# Patient Record
Sex: Female | Born: 1975 | Race: Black or African American | Hispanic: No | Marital: Single | Smoking: Never smoker
Health system: Southern US, Community
[De-identification: ages and names within clinical notes are randomized; demographics above are authoritative.]

## PROBLEM LIST (undated history)

## (undated) DIAGNOSIS — E7800 Pure hypercholesterolemia, unspecified: Secondary | ICD-10-CM

## (undated) DIAGNOSIS — I10 Essential (primary) hypertension: Secondary | ICD-10-CM

---

## 1997-12-03 ENCOUNTER — Ambulatory Visit (HOSPITAL_COMMUNITY): Admission: RE | Admit: 1997-12-03 | Discharge: 1997-12-03 | Payer: Self-pay | Admitting: Obstetrics

## 1997-12-03 ENCOUNTER — Other Ambulatory Visit: Admission: RE | Admit: 1997-12-03 | Discharge: 1997-12-03 | Payer: Self-pay | Admitting: Obstetrics

## 1998-01-01 ENCOUNTER — Ambulatory Visit (HOSPITAL_COMMUNITY): Admission: RE | Admit: 1998-01-01 | Discharge: 1998-01-01 | Payer: Self-pay | Admitting: Obstetrics

## 1998-03-27 ENCOUNTER — Ambulatory Visit (HOSPITAL_COMMUNITY): Admission: RE | Admit: 1998-03-27 | Discharge: 1998-03-27 | Payer: Self-pay | Admitting: Obstetrics

## 1998-04-07 ENCOUNTER — Ambulatory Visit (HOSPITAL_COMMUNITY): Admission: RE | Admit: 1998-04-07 | Discharge: 1998-04-07 | Payer: Self-pay | Admitting: Obstetrics

## 1998-05-05 ENCOUNTER — Inpatient Hospital Stay (HOSPITAL_COMMUNITY): Admission: AD | Admit: 1998-05-05 | Discharge: 1998-05-12 | Payer: Self-pay | Admitting: Obstetrics

## 2000-03-11 ENCOUNTER — Emergency Department (HOSPITAL_COMMUNITY): Admission: EM | Admit: 2000-03-11 | Discharge: 2000-03-11 | Payer: Self-pay | Admitting: Emergency Medicine

## 2000-03-11 ENCOUNTER — Encounter: Payer: Self-pay | Admitting: Emergency Medicine

## 2001-07-31 ENCOUNTER — Encounter: Payer: Self-pay | Admitting: Emergency Medicine

## 2001-07-31 ENCOUNTER — Emergency Department (HOSPITAL_COMMUNITY): Admission: EM | Admit: 2001-07-31 | Discharge: 2001-07-31 | Payer: Self-pay | Admitting: Emergency Medicine

## 2002-02-06 ENCOUNTER — Inpatient Hospital Stay (HOSPITAL_COMMUNITY): Admission: AD | Admit: 2002-02-06 | Discharge: 2002-02-06 | Payer: Self-pay | Admitting: Obstetrics

## 2002-02-06 ENCOUNTER — Encounter: Payer: Self-pay | Admitting: Obstetrics

## 2002-03-13 ENCOUNTER — Encounter (INDEPENDENT_AMBULATORY_CARE_PROVIDER_SITE_OTHER): Payer: Self-pay

## 2002-03-13 ENCOUNTER — Inpatient Hospital Stay (HOSPITAL_COMMUNITY): Admission: AD | Admit: 2002-03-13 | Discharge: 2002-03-16 | Payer: Self-pay | Admitting: Obstetrics

## 2002-11-12 ENCOUNTER — Emergency Department (HOSPITAL_COMMUNITY): Admission: EM | Admit: 2002-11-12 | Discharge: 2002-11-12 | Payer: Self-pay | Admitting: Emergency Medicine

## 2004-08-10 ENCOUNTER — Emergency Department (HOSPITAL_COMMUNITY): Admission: EM | Admit: 2004-08-10 | Discharge: 2004-08-11 | Payer: Self-pay | Admitting: Emergency Medicine

## 2005-05-22 ENCOUNTER — Emergency Department (HOSPITAL_COMMUNITY): Admission: EM | Admit: 2005-05-22 | Discharge: 2005-05-22 | Payer: Self-pay | Admitting: Emergency Medicine

## 2006-03-30 ENCOUNTER — Emergency Department (HOSPITAL_COMMUNITY): Admission: EM | Admit: 2006-03-30 | Discharge: 2006-03-30 | Payer: Self-pay | Admitting: Emergency Medicine

## 2006-07-06 ENCOUNTER — Encounter: Admission: RE | Admit: 2006-07-06 | Discharge: 2006-07-06 | Payer: Self-pay | Admitting: Obstetrics and Gynecology

## 2007-08-26 ENCOUNTER — Emergency Department (HOSPITAL_COMMUNITY): Admission: EM | Admit: 2007-08-26 | Discharge: 2007-08-26 | Payer: Self-pay | Admitting: Emergency Medicine

## 2008-02-24 ENCOUNTER — Emergency Department (HOSPITAL_COMMUNITY): Admission: EM | Admit: 2008-02-24 | Discharge: 2008-02-24 | Payer: Self-pay | Admitting: Emergency Medicine

## 2008-03-03 ENCOUNTER — Emergency Department (HOSPITAL_COMMUNITY): Admission: EM | Admit: 2008-03-03 | Discharge: 2008-03-03 | Payer: Self-pay | Admitting: Emergency Medicine

## 2008-03-06 ENCOUNTER — Emergency Department (HOSPITAL_COMMUNITY): Admission: EM | Admit: 2008-03-06 | Discharge: 2008-03-06 | Payer: Self-pay | Admitting: Emergency Medicine

## 2008-04-29 ENCOUNTER — Emergency Department (HOSPITAL_COMMUNITY): Admission: EM | Admit: 2008-04-29 | Discharge: 2008-04-29 | Payer: Self-pay | Admitting: Emergency Medicine

## 2008-05-31 ENCOUNTER — Emergency Department (HOSPITAL_COMMUNITY): Admission: EM | Admit: 2008-05-31 | Discharge: 2008-05-31 | Payer: Self-pay | Admitting: Emergency Medicine

## 2008-06-06 ENCOUNTER — Emergency Department (HOSPITAL_COMMUNITY): Admission: EM | Admit: 2008-06-06 | Discharge: 2008-06-06 | Payer: Self-pay | Admitting: Obstetrics & Gynecology

## 2008-07-09 ENCOUNTER — Emergency Department (HOSPITAL_COMMUNITY): Admission: EM | Admit: 2008-07-09 | Discharge: 2008-07-09 | Payer: Self-pay | Admitting: Emergency Medicine

## 2009-04-10 ENCOUNTER — Emergency Department (HOSPITAL_COMMUNITY): Admission: EM | Admit: 2009-04-10 | Discharge: 2009-04-10 | Payer: Self-pay | Admitting: Emergency Medicine

## 2010-09-05 LAB — POCT URINALYSIS DIP (DEVICE)
Nitrite: NEGATIVE
Protein, ur: 30 mg/dL — AB
pH: 6.5 (ref 5.0–8.0)

## 2010-09-05 LAB — URINE CULTURE

## 2010-09-05 LAB — POCT PREGNANCY, URINE: Preg Test, Ur: NEGATIVE

## 2010-10-07 NOTE — Op Note (Signed)
   [name], [name]                          ACCOUNT NO.:  [id number]   MEDICAL RECORD NO.:  [id number]                   PATIENT TYPE:  INP   LOCATION:  9152                                 FACILITY:  [facility]   PHYSICIAN:  Kathreen Cosier, M.D.           DATE OF BIRTH:  09/09/1975   DATE OF PROCEDURE:  03/13/2002  DATE OF DISCHARGE:                                 OPERATIVE REPORT   ANESTHESIA:  Spinal.   ESTIMATED BLOOD LOSS:  500 cc.   DESCRIPTION OF PROCEDURE:  The patient placed on the operating table in a  supine position.  Abdomen prepped and draped, bladder emptied with a Foley  catheter.  Transverse suprapubic incision made through the old scar, carried  down to the rectus fascia, dissecting this out the length of the incision.  Rectus muscles retracted laterally.  The peritoneum incised longitudinally.  A transverse incision made in the visceral peritoneum above the bladder and  the bladder mobilized inferiorly.  A transverse lower uterine incision made.  The fluid was meconium-stained.  The baby was DeLee suctioned prior to  delivery of the shoulders.  There was a nuchal cord, which was loose, and  this was reduced.  She was delivered of a female, Apgar 8 and 9, 6 pounds 12  ounces, from the LOP position.  The placenta was anterior and removed  manually.  Uterine cavity cleaned with dry laps.  Uterine incision closed in  one layer with continuous suture of #1 chromic.  Hemostasis was  satisfactory.  Bladder flap reattached with 2-0 chromic.  Uterus well-  contracted, tubes and ovaries normal.  Abdomen closed in layers, peritoneum  with continuous suture of 0 chromic, fascia with continuous suture of 0  Dexon, and the skin closed with subcuticular suture of 3-0 Monocryl.  The  patient tolerated the procedure well, taken to the recovery room in good  condition.                                               Kathreen Cosier, M.D.    BAM/MEDQ  D:  03/13/2002  T:   03/14/2002  Job:  [id number]

## 2010-10-07 NOTE — Discharge Summary (Signed)
   Melanie [name], Melanie [name]                          ACCOUNT NO.:  [id number]   MEDICAL RECORD NO.:  [id number]                   PATIENT TYPE:  INP   LOCATION:  9147                                 FACILITY:  [facility]   PHYSICIAN:  Roseanna Rainbow, M.D.         DATE OF BIRTH:  1976-04-11   DATE OF ADMISSION:  03/13/2002  DATE OF DISCHARGE:  03/16/2002                                 DISCHARGE SUMMARY   CHIEF COMPLAINT:  The patient is a 35 year old Caucasian female Gravida II,  Para I with a history of previous C-section delivery, who presents for  elective repeat C-section delivery. Please see the dictated history and  physical as per Francoise Ceo, M.D., for further details.   HOSPITAL COURSE:  The patient was admitted and underwent a repeat C-section  delivery. Again, please see the dictated operative summary as per Francoise Ceo, M.D., for further details. She received magnesium sulfate seizure  prophylaxis postpartum for preeclampsia. Her preeclampsia remained mild and  the magnesium sulfate was discontinued 24 hours after delivery. She was  discharged to home on postoperative day 3 with borderline elevated blood  pressures and tolerating a regular diet.   DISCHARGE DIAGNOSES:  1. Term intrauterine pregnancy.  2. Mild preeclampsia.  3. History of previous C-section delivery.  4. Elective repeat C-section delivery.   PROCEDURE:  Repeat C-section delivery.   CONDITION ON DISCHARGE:  Stable.   DIET:  Regular.   ACTIVITY:  As per instruction booklet.   MEDICATIONS:  Included Ibuprofen, Percocet, Micronor, prenatal vitamins, and  Ferrous sulfate.   DISPOSITION:  The patient was to follow-up with Dr. Gaynell Face in one week for  blood pressure check and incision check.                                               Roseanna Rainbow, M.D.    Melanie [name]  D:  04/11/2002  T:  04/11/2002  Job:  [id number]

## 2010-10-07 NOTE — H&P (Signed)
   Melanie [name], Melanie [name]                          ACCOUNT NO.:  [id number]   MEDICAL RECORD NO.:  [id number]                   PATIENT TYPE:  INP   LOCATION:  9152                                 FACILITY:  [facility]   PHYSICIAN:  Kathreen Cosier, M.D.           DATE OF BIRTH:  [birth year]   DATE OF ADMISSION:  03/13/2002  DATE OF DISCHARGE:                                HISTORY & PHYSICAL   HISTORY OF PRESENT ILLNESS:  Patient is a 35 year old white female G2, P1-0-  0-1 who had previous cesarean section for preeclampsia and failure of  induction of labor.  With this pregnancy, her blood pressures were 110/60 to  110/70.  She did have several blood pressures 160/90.  She had 3+ protein  and her uric acid was 6.7.  Cervix was closed with vertex, -3.  She is  admitted with diagnosis of preeclampsia.  Her EDC is March 24, 2002.  Patient wanted repeat cesarean section.  She was brought in and started on  magnesium sulfate 12.0 g load and then 2.0 g per hour.   PHYSICAL EXAMINATION:  GENERAL:  Obese female in no distress.  HEENT:  Negative.  LUNGS:  Clear.  HEART:  Regular rate and rhythm without murmur or gallops.  ABDOMEN:  Term size uterus.  PELVIC:  As above.  EXTREMITIES:  Negative.                                                Kathreen Cosier, M.D.    BAM/MEDQ  D:  03/13/2002  T:  03/14/2002  Job:  [id number]

## 2011-02-20 LAB — TSH: TSH: 0.784

## 2011-02-20 LAB — DIFFERENTIAL
Eosinophils Absolute: 0
Eosinophils Relative: 0
Lymphocytes Relative: 11 — ABNORMAL LOW
Lymphs Abs: 1.1
Monocytes Absolute: 0.5
Monocytes Relative: 5

## 2011-02-20 LAB — POCT I-STAT, CHEM 8
BUN: 11
Calcium, Ion: 1.25
Creatinine, Ser: 1
Hemoglobin: 15.6 — ABNORMAL HIGH
TCO2: 28

## 2011-02-20 LAB — CBC
HCT: 44.8
Hemoglobin: 14.7
MCV: 84
RBC: 5.33 — ABNORMAL HIGH
WBC: 10.5

## 2011-02-20 LAB — PREGNANCY, URINE: Preg Test, Ur: NEGATIVE

## 2011-02-20 LAB — URINALYSIS, ROUTINE W REFLEX MICROSCOPIC
Bilirubin Urine: NEGATIVE
Glucose, UA: NEGATIVE
Ketones, ur: NEGATIVE
pH: 6.5

## 2011-02-20 LAB — URINE MICROSCOPIC-ADD ON

## 2011-02-20 LAB — POCT CARDIAC MARKERS: Myoglobin, poc: 113

## 2011-02-20 LAB — D-DIMER, QUANTITATIVE: D-Dimer, Quant: 0.3

## 2011-02-23 LAB — COMPREHENSIVE METABOLIC PANEL
CO2: 29 mEq/L (ref 19–32)
Calcium: 9.5 mg/dL (ref 8.4–10.5)
Creatinine, Ser: 0.82 mg/dL (ref 0.4–1.2)
GFR calc non Af Amer: >60 mL/min (ref >60)
Glucose, Bld: 109 mg/dL — ABNORMAL HIGH (ref 70–99)

## 2011-02-23 LAB — POCT PREGNANCY, URINE: Preg Test, Ur: NEGATIVE

## 2011-02-23 LAB — DIFFERENTIAL
Lymphocytes Relative: 12 % (ref 12–46)
Lymphs Abs: 1.2 10*3/uL (ref 0.7–4.0)
Neutro Abs: 7.8 10*3/uL — ABNORMAL HIGH (ref 1.7–7.7)
Neutrophils Relative %: 82 % — ABNORMAL HIGH (ref 43–77)

## 2011-02-23 LAB — CBC
Hemoglobin: 14.7 g/dL (ref 12.0–15.0)
MCHC: 33.7 g/dL (ref 30.0–36.0)
MCV: 83.1 fL (ref 78.0–100.0)
RBC: 5.24 MIL/uL — ABNORMAL HIGH (ref 3.87–5.11)

## 2011-02-23 LAB — D-DIMER, QUANTITATIVE: D-Dimer, Quant: 0.24 ug/mL-FEU (ref 0.00–0.48)

## 2011-02-23 LAB — CK TOTAL AND CKMB (NOT AT ARMC): CK, MB: 2.6 ng/mL (ref 0.3–4.0)

## 2016-04-07 ENCOUNTER — Emergency Department (HOSPITAL_COMMUNITY): Payer: 59

## 2016-04-07 ENCOUNTER — Emergency Department (HOSPITAL_COMMUNITY)
Admission: EM | Admit: 2016-04-07 | Discharge: 2016-04-07 | Disposition: A | Discharge status: Home / Self Care | Payer: 59 | Attending: Emergency Medicine | Admitting: Emergency Medicine

## 2016-04-07 ENCOUNTER — Encounter (HOSPITAL_COMMUNITY): Payer: Self-pay | Admitting: Emergency Medicine

## 2016-04-07 DIAGNOSIS — I10 Essential (primary) hypertension: Secondary | ICD-10-CM | POA: Insufficient documentation

## 2016-04-07 DIAGNOSIS — R42 Dizziness and giddiness: Secondary | ICD-10-CM | POA: Diagnosis not present

## 2016-04-07 DIAGNOSIS — Z79899 Other long term (current) drug therapy: Secondary | ICD-10-CM | POA: Insufficient documentation

## 2016-04-07 HISTORY — DX: Pure hypercholesterolemia, unspecified: E78.00

## 2016-04-07 HISTORY — DX: Essential (primary) hypertension: I10

## 2016-04-07 LAB — CBC
HEMATOCRIT: 40.9 % (ref 36.0–46.0)
Hemoglobin: 14.4 g/dL (ref 12.0–15.0)
MCH: 28.2 pg (ref 26.0–34.0)
MCHC: 35.2 g/dL (ref 30.0–36.0)
MCV: 80 fL (ref 78.0–100.0)
PLATELETS: 259 10*3/uL (ref 150–400)
RBC: 5.11 MIL/uL (ref 3.87–5.11)
RDW: 13.4 % (ref 11.5–15.5)
WBC: 7.2 10*3/uL (ref 4.0–10.5)

## 2016-04-07 LAB — BASIC METABOLIC PANEL
Anion gap: 9 (ref 5–15)
BUN: 9 mg/dL (ref 6–20)
CHLORIDE: 105 mmol/L (ref 101–111)
CO2: 25 mmol/L (ref 22–32)
CREATININE: 0.85 mg/dL (ref 0.44–1.00)
Calcium: 9.1 mg/dL (ref 8.9–10.3)
GFR calc non Af Amer: >60 mL/min (ref >60)
Glucose, Bld: 110 mg/dL — ABNORMAL HIGH (ref 65–99)
POTASSIUM: 3.3 mmol/L — AB (ref 3.5–5.1)
SODIUM: 139 mmol/L (ref 135–145)

## 2016-04-07 LAB — I-STAT BETA HCG BLOOD, ED (MC, WL, AP ONLY)

## 2016-04-07 LAB — HEPATIC FUNCTION PANEL
ALT: 21 U/L (ref 14–54)
AST: 20 U/L (ref 15–41)
Albumin: 4.8 g/dL (ref 3.5–5.0)
Alkaline Phosphatase: 65 U/L (ref 38–126)
Total Bilirubin: 0.7 mg/dL (ref 0.3–1.2)
Total Protein: 8.1 g/dL (ref 6.5–8.1)

## 2016-04-07 LAB — TROPONIN I

## 2016-04-07 LAB — I-STAT TROPONIN, ED: Troponin i, poc: 0 ng/mL (ref 0.00–0.08)

## 2016-04-07 MED ORDER — SODIUM CHLORIDE 0.9 % IV BOLUS (SEPSIS)
1000.0000 mL | Freq: Once | INTRAVENOUS | Status: AC
  Administered 2016-04-07: 1000 mL via INTRAVENOUS

## 2016-04-07 NOTE — ED Triage Notes (Signed)
Pt complaint of onset intermittent dizziness and intermittent central chest pain with standing ONLY at 0800 today. Pt denies other symptoms.

## 2016-04-07 NOTE — ED Notes (Signed)
Patient returned from xray.

## 2016-04-07 NOTE — Discharge Instructions (Signed)
As discussed, your evaluation today has been largely reassuring.  But, it is important that you monitor your condition carefully, and do not hesitate to return to the ED if you develop new, or concerning changes in your condition.  Otherwise, please follow-up with your physician for appropriate ongoing care.  In addition, please be sure to follow-up with our cardiology colleagues for additional evaluation and consideration of Holter monitoring.

## 2016-04-07 NOTE — ED Notes (Signed)
ED Provider at bedside. 

## 2016-04-07 NOTE — ED Provider Notes (Signed)
[facility]-EMERGENCY DEPT Provider Note   [name]: [id number] Arrival date & time: 04/07/16  0856     History   Chief Complaint Chief Complaint  Patient presents with  . Dizziness  . Chest Pain    HPI Melanie [name] is a 40 y.o. female.  HPI  Patient presents after an episode of dizziness. Patient notes that she has had similar episodes in the past, but today had a more profound than usual episode. She states that upon standing up to go to her child's room she felt dizzy, lightheaded, near syncopal or Symptoms improved with rest, but became apparent again when she stood up. Currently, the patient is resting in a gurney, has no complaint.  During these episodes there was some chest tightness. 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 She denies hematemesis, melena, hematochezia.   Past Medical History:  Diagnosis Date  . High cholesterol   . Hypertension     There are no active problems to display for this patient.   History reviewed. No pertinent surgical history.  OB History    No data available       Home Medications    Prior to Admission medications   Medication Sig Start Date End Date Taking? Authorizing Provider  amLODipine (NORVASC) 5 MG tablet Take 5 mg by mouth every morning.   Yes Historical Provider, MD  Omega-3 Fatty Acids (FISH OIL) 1000 MG CAPS Take 1 capsule by mouth daily.   Yes Historical Provider, MD    Family History No family history on file.  Social History Social History  Substance Use Topics  . Smoking status: Never Smoker  . Smokeless tobacco: Never Used  . Alcohol use No     Allergies   Percocet [oxycodone-acetaminophen]   Review of Systems Review of Systems  Constitutional:       Per HPI, otherwise negative  HENT:       Per HPI, otherwise negative    Respiratory:       Per HPI, otherwise negative  Cardiovascular:       Per HPI, otherwise negative  Gastrointestinal: Negative for vomiting.  Endocrine:       Negative aside from HPI  Genitourinary:       Neg aside from HPI   Musculoskeletal:       Per HPI, otherwise negative  Skin: Negative.   Neurological: Positive for dizziness and light-headedness. Negative for syncope.     Physical Exam Updated Vital Signs BP 137/60 (BP Location: Left Arm)   Pulse 109   Temp 97.8 F (36.6 C) (Oral)   Resp 20   Wt 208 lb (94.3 kg)   LMP 03/24/2016   SpO2 100%   Physical Exam  Constitutional: She is oriented to person, place, and time. She appears well-developed and well-nourished. No distress.  HENT:  Head: Normocephalic and atraumatic.  Eyes: Conjunctivae and EOM are normal.  Cardiovascular: Normal rate and regular rhythm.   Pulmonary/Chest: Effort normal and breath sounds normal. No stridor. No respiratory distress.  Abdominal: She exhibits no distension. There is no tenderness. There is no guarding.  Musculoskeletal: She exhibits no edema.  Neurological: She is alert and oriented to person, place, and time. She displays no atrophy and no tremor. No cranial nerve deficit or sensory deficit. She exhibits normal muscle tone. Coordination normal.  Skin: Skin is warm and dry.  Psychiatric: She has  a normal mood and affect.  Nursing note and vitals reviewed.    ED Treatments / Results  Labs (all labs ordered are listed, but only abnormal results are displayed) Labs Reviewed  BASIC METABOLIC PANEL - Abnormal; Notable for the following:       Result Value   Potassium 3.3 (*)    Glucose, Bld 110 (*)    All other components within normal limits  HEPATIC FUNCTION PANEL - Abnormal; Notable for the following:    Bilirubin, Direct <0.1 (*)    All other components within normal limits  CBC  TROPONIN I  I-STAT TROPOININ, ED  I-STAT BETA HCG BLOOD, ED ([facility], [facility], [facility] ONLY)    EKG  EKG  Interpretation  Date/Time:  Friday April 07 2016 09:08:19 EST Ventricular Rate:  111 PR Interval:    QRS Duration: 85 QT Interval:  337 QTC Calculation: 458 R Axis:   79 Text Interpretation:      EKG today is slightly different from EKG at the office, with less T-wave inversion, but with persistent T-wave flattening ABNORMAL   Radiology Dg Chest 2 View  Result Date: 04/07/2016 CLINICAL DATA:  Dizzy spells, light headedness, with a little bit of central chest pains, SOB, and nausea. No vomiting. No fever. Pt takes HTN meds. Not diabetic. Nonsmoker. EXAM: CHEST  2 VIEW COMPARISON:  07/09/2008 FINDINGS: The heart size and mediastinal contours are within normal limits. Both lungs are clear. No pleural effusion or pneumothorax. The visualized skeletal structures are unremarkable. IMPRESSION: No active cardiopulmonary disease. Electronically Signed   By: Amie Portlandavid  Ormond M.D.   On: 04/07/2016 09:44    Procedures Procedures (including critical care time)  Medications Ordered in ED Medications  sodium chloride 0.9 % bolus 1,000 mL (0 mLs Intravenous Stopped 04/07/16 1124)     Initial Impression / Assessment and Plan / ED Course  I have reviewed the triage vital signs and the nursing notes.  Pertinent labs & imaging results that were available during my care of the patient were reviewed by me and considered in my medical decision making (see chart for details).  Clinical Course     On repeat exam after 2 negative troponins patient is awake and alert, in no distress, no ongoing complaints per She, her husband and I had a lengthy conversation about all results including 2 negative troponins, nonischemic EKG. In this asymmetric patient who is presenting after several episodes of near syncope, dizziness, but again has no ongoing complaints, there is some suspicion for intermittent arrhythmia versus vertigo. Here the after-hours of monitoring, the patient has had no evidence for sustained  arrhythmia no evidence for ACS, PE, pneumonia, electrolyte abnormalities. After lengthy conversation about all findings, patient will follow-up as an outpatient  Final Clinical Impressions(s) / ED Diagnoses   Final diagnoses:  Dizziness  Near syncope   Gerhard Munchobert Josephanthony Tindel, MD 04/07/16 1321

## 2016-04-10 NOTE — Progress Notes (Signed)
Cardiology Office Note   Date:  04/11/2016   ID:  Melanie [name], DOB 10/29/1975, MRN [id number]  PCP:  Leola BrazilEverly, Rebecca B, DO  Cardiologist:   Charlton HawsPeter Berlyn Saylor, MD   Chief Complaint  Patient presents with  . Establish Care    dizziness, near syncope, heaviness in chest, per pt      History of Present Illness: Melanie Manson PasseyBrown is a 40 y.o. female who presents for chest pain and dizziness  Seen in ER 11/17 Postural symptoms worse when standing up.  CRF;s HTN And elevated lipids.  Some chest pain with episodes  ? Diagnosis of vertigo  Vs intermittent arrhythmia She R/O nothing on telemetry CXR reviewed NAD  Labs ok except K 3.3   She use to work for [employer]. She had lost 30 lbs a couple of years ago and came Off HTN meds but 8 months ago put on norvasc. Symptoms started when put On new birth control pill but did not stop after she stopped med  3 episodes of dizzy feeling No palpitations thinks her HR went higher when She got anxious about symptoms   Past Medical History:  Diagnosis Date  . High cholesterol   . Hypertension     History reviewed. No pertinent surgical history.   Current Outpatient Prescriptions  Medication Sig Dispense Refill  . amLODipine (NORVASC) 5 MG tablet Take 5 mg by mouth every morning.    . Omega-3 Fatty Acids (FISH OIL) 1000 MG CAPS Take 1 capsule by mouth daily.     No current facility-administered medications for this visit.     Allergies:   Percocet [oxycodone-acetaminophen]    Social History:  The patient  reports that she has never smoked. She has never used smokeless tobacco. She reports that she does not drink alcohol or use drugs.   Family History:  The patient's family history is not on file.    ROS:  Please see the history of present illness.   Otherwise, review of systems are positive for none.   All other systems are reviewed and negative.    PHYSICAL EXAM: VS:  BP 140/70 (BP Location: Left Arm, Patient Position: Sitting, Cuff  Size: Normal)   Pulse 100   Ht 5' 1.5" (1.562 m)   Wt 92.9 kg (204 lb 12.8 oz)   LMP 03/24/2016   SpO2 99%   BMI 38.07 kg/m  , BMI Body mass index is 38.07 kg/m. Affect appropriate Healthy:  appears stated age HEENT: normal Neck supple with no adenopathy JVP normal no bruits no thyromegaly Lungs clear with no wheezing and good diaphragmatic motion Heart:  S1/S2 no murmur, no rub, gallop or click PMI normal Abdomen: benighn, BS positve, no tenderness, no AAA no bruit.  No HSM or HJR Distal pulses intact with no bruits No edema Neuro non-focal Skin warm and dry No muscular weakness    EKG:   ST rate 111 nonspecific ST/T wave changes    Recent Labs: 04/07/2016: ALT 21; BUN 9; Creatinine, Ser 0.85; Hemoglobin 14.4; Platelets 259; Potassium 3.3; Sodium 139    Lipid Panel No results found for: CHOL, TRIG, HDL, CHOLHDL, VLDL, LDLCALC, LDLDIRECT    Wt Readings from Last 3 Encounters:  04/11/16 92.9 kg (204 lb 12.8 oz)  04/07/16 94.3 kg (208 lb)      Other studies Reviewed: Additional studies/ records that were reviewed today include: notes from primary ER ECG labs and CXR .    ASSESSMENT AND PLAN:  1.  Pre syncope-  sounds more like vertigo. Has sinus arrhythmia on exam with relatively high HR. Check echo and event monitor to r/o structural heart dx and arrhythmia  2. HTN:  D/c norvasc and try lopressor to settle out HR/rhythm 3. Chest Pain: denies to me not major issue ECG with nonspecific ST/T wave changes Can consider f/u ETT after BP meds changed monitor and echo    Current medicines are reviewed at length with the patient today.  The patient does not have concerns regarding medicines.  The following changes have been made:  Dc norvasc lopressor 25 bid   Labs/ tests ordered today include: echo and event monitor  No orders of the defined types were placed in this encounter.    Disposition:   FU with me next available      Signed, Charlton HawsPeter Hala Narula, MD    04/11/2016 11:14 AM    [facility] [address], [place], [place]  [address] Phone: [phone]; Fax: [phone]

## 2016-04-11 ENCOUNTER — Encounter: Payer: Self-pay | Admitting: Cardiovascular Disease

## 2016-04-11 ENCOUNTER — Ambulatory Visit (INDEPENDENT_AMBULATORY_CARE_PROVIDER_SITE_OTHER): Payer: 59 | Admitting: Cardiovascular Disease

## 2016-04-11 ENCOUNTER — Encounter (INDEPENDENT_AMBULATORY_CARE_PROVIDER_SITE_OTHER): Payer: Self-pay

## 2016-04-11 VITALS — BP 140/70 | HR 100 | Ht 61.5 in | Wt 204.8 lb

## 2016-04-11 DIAGNOSIS — Z7689 Persons encountering health services in other specified circumstances: Secondary | ICD-10-CM | POA: Diagnosis not present

## 2016-04-11 DIAGNOSIS — R55 Syncope and collapse: Secondary | ICD-10-CM | POA: Diagnosis not present

## 2016-04-11 DIAGNOSIS — R002 Palpitations: Secondary | ICD-10-CM | POA: Diagnosis not present

## 2016-04-11 MED ORDER — METOPROLOL TARTRATE 25 MG PO TABS: 25.0000 mg | ORAL_TABLET | Freq: Two times a day (BID) | ORAL | 11 refills | Status: DC

## 2016-04-11 NOTE — Patient Instructions (Signed)
Medication Instructions:  1) STOP AMLODIPINE 2) START METOPROLOL 25 mg TWICE DAILY  Labwork: None  Testing/Procedures: Your physician has recommended that you wear an event monitor. Event monitors are medical devices that record the heart's electrical activity. Doctors most often us these monitors to diagnose arrhythmias. Arrhythmias are problems with the speed or rhythm of the heartbeat. The monitor is a small, portable device. You can wear one while you do your normal daily activities. This is usually used to diagnose what is causing palpitations/syncope (passing out).   Your physician has requested that you have an echocardiogram. Echocardiography is a painless test that uses sound waves to create images of your heart. It provides your doctor with information about the size and shape of your heart and how well your heart's chambers and valves are working. This procedure takes approximately one hour. There are no restrictions for this procedure.  Follow-Up: Your physician recommends that you schedule a follow-up appointment next available with Dr. Eden EmmsNishan.  Any Other Special Instructions Will Be Listed Below (If Applicable).     If you need a refill on your cardiac medications before your next appointment, please call your pharmacy.

## 2016-05-08 ENCOUNTER — Other Ambulatory Visit (HOSPITAL_COMMUNITY): Payer: 59

## 2016-06-25 NOTE — Progress Notes (Deleted)
Cardiology Office Note   Date:  06/25/2016   ID:  Melanie [name], DOB [birth year], MRN [id number]  PCP:  Leola BrazilEverly, Rebecca B, DO  Cardiologist:   Charlton HawsPeter Nishan, MD   No chief complaint on file.     History of Present Illness: Melanie [name] is a 41 y.o. female who presents for chest pain and dizziness  Seen in ER 11/17 Postural symptoms worse when standing up.  CRF;s HTN And elevated lipids.  Some chest pain with episodes  ? Diagnosis of vertigo  Vs intermittent arrhythmia She R/O nothing on telemetry CXR reviewed NAD  Labs ok except K 3.3   She use to work for [employer]. She had lost 30 lbs a couple of years ago and came Off HTN meds but 8 months ago put on norvasc. Symptoms started when put On new birth control pill but did not stop after she stopped med  3 episodes of dizzy feeling No palpitations thinks her HR went higher when She got anxious about symptoms   04/11/16 norvasc stopped and placed on beta blocker Was supposed to get event monitor and echo not done    Past Medical History:  Diagnosis Date  . High cholesterol   . Hypertension     No past surgical history on file.   Current Outpatient Prescriptions  Medication Sig Dispense Refill  . metoprolol tartrate (LOPRESSOR) 25 MG tablet Take 1 tablet (25 mg total) by mouth 2 (two) times daily. 60 tablet 11  . Omega-3 Fatty Acids (FISH OIL) 1000 MG CAPS Take 1 capsule by mouth daily.     No current facility-administered medications for this visit.     Allergies:   Percocet [oxycodone-acetaminophen]    Social History:  The patient  reports that she has never smoked. She has never used smokeless tobacco. She reports that she does not drink alcohol or use drugs.   Family History:  The patient's family history is not on file.    ROS:  Please see the history of present illness.   Otherwise, review of systems are positive for none.   All other systems are reviewed and negative.    PHYSICAL EXAM: VS:  There were no  vitals taken for this visit. , BMI There is no height or weight on file to calculate BMI. Affect appropriate Healthy:  appears stated age HEENT: normal Neck supple with no adenopathy JVP normal no bruits no thyromegaly Lungs clear with no wheezing and good diaphragmatic motion Heart:  S1/S2 no murmur, no rub, gallop or click PMI normal Abdomen: benighn, BS positve, no tenderness, no AAA no bruit.  No HSM or HJR Distal pulses intact with no bruits No edema Neuro non-focal Skin warm and dry No muscular weakness    EKG:   ST rate 111 nonspecific ST/T wave changes    Recent Labs: 04/07/2016: ALT 21; BUN 9; Creatinine, Ser 0.85; Hemoglobin 14.4; Platelets 259; Potassium 3.3; Sodium 139    Lipid Panel No results found for: CHOL, TRIG, HDL, CHOLHDL, VLDL, LDLCALC, LDLDIRECT    Wt Readings from Last 3 Encounters:  04/11/16 204 lb 12.8 oz (92.9 kg)  04/07/16 208 lb (94.3 kg)      Other studies Reviewed: Additional studies/ records that were reviewed today include: notes from primary ER ECG labs and CXR .    ASSESSMENT AND PLAN:  1.  Pre syncope- sounds more like vertigo. Has sinus arrhythmia on exam with relatively high HR. Check echo and event monitor to r/o structural heart  dx and arrhythmia  2. HTN:  Better with beta blocker  3. Chest Pain: denies to me not major issue ECG with nonspecific ST/T wave changes Can consider f/u ETT after BP meds changed monitor and echo    Disposition:   FU      Charlton Haws

## 2016-06-29 ENCOUNTER — Ambulatory Visit: Payer: 59 | Admitting: Cardiovascular Disease

## 2016-07-05 ENCOUNTER — Telehealth: Payer: Self-pay | Admitting: Cardiovascular Disease

## 2016-07-05 NOTE — Telephone Encounter (Signed)
Pt has been contacted several times to get scheduled for Monitor and echo appt. Patient has not returned any calls at this time. Please see documentation below. At this time we [facility]   07/05/2016 LMOM for pt to call and schedule Monitor and Echo appt. stpegram   06/21/2016 LMOM for pt to call and schedule Monitor appt. stpegram  05/05/2016 per pt/will call back at a later time) PATIENT CANCEL ECHO ANS MONITOR/D.MILLER  05/31/16 LMOM TO CALL AND RESCHEDULE/D.MILLER  Lamar LaundrySonya

## 2016-07-12 NOTE — Telephone Encounter (Signed)
Sent patient a letter to call and schedule test and follow-up after test are complete.

## 2018-07-18 ENCOUNTER — Ambulatory Visit
Admission: EM | Admit: 2018-07-18 | Discharge: 2018-07-18 | Disposition: A | Discharge status: Home / Self Care | Payer: 59 | Attending: Nurse Practitioner | Admitting: Nurse Practitioner

## 2018-07-18 ENCOUNTER — Encounter: Payer: Self-pay | Admitting: Emergency Medicine

## 2018-07-18 DIAGNOSIS — J111 Influenza due to unidentified influenza virus with other respiratory manifestations: Secondary | ICD-10-CM

## 2018-07-18 DIAGNOSIS — R509 Fever, unspecified: Secondary | ICD-10-CM | POA: Diagnosis not present

## 2018-07-18 DIAGNOSIS — R69 Illness, unspecified: Principal | ICD-10-CM

## 2018-07-18 MED ORDER — PROMETHAZINE-DM 6.25-15 MG/5ML PO SYRP: 5.0000 mL | ORAL_SOLUTION | Freq: Four times a day (QID) | ORAL | 0 refills | Status: DC | PRN

## 2018-07-18 MED ORDER — OSELTAMIVIR PHOSPHATE 75 MG PO CAPS: 75.0000 mg | ORAL_CAPSULE | Freq: Two times a day (BID) | ORAL | 0 refills | Status: DC

## 2018-07-18 MED ORDER — IBUPROFEN 600 MG PO TABS: 600.0000 mg | ORAL_TABLET | Freq: Four times a day (QID) | ORAL | 0 refills | Status: DC | PRN

## 2018-07-18 MED ORDER — CETIRIZINE HCL 10 MG PO TABS: 10.0000 mg | ORAL_TABLET | Freq: Every day | ORAL | 0 refills | Status: DC

## 2018-07-18 MED ORDER — ACETAMINOPHEN 325 MG PO TABS
650.0000 mg | ORAL_TABLET | Freq: Once | ORAL | Status: AC
  Administered 2018-07-18: 650 mg via ORAL

## 2018-07-18 NOTE — ED Provider Notes (Signed)
[name]    [name]: [id number] Arrival date & time: 07/18/18  1836     History   Chief Complaint Chief Complaint  Patient presents with  . Influenza    HPI Melanie [name] is a 43 y.o. female.   Subjective:   Melanie [name] is a 43 y.o. female who presents for evaluation of influenza like symptoms. Symptoms include suspected fevers but not measured at home, chills, hot and cold spells, sweats, dry cough, headache, myalgias and sneezing and have been present for 2 days. She has tried to alleviate the symptoms with OTC cough medicine with minimal relief. High risk factors for influenza complications: none.  She denies any known flu contacts.  She has not had a flu vaccine this season.  The following portions of the patient's history were reviewed and updated as appropriate: allergies, current medications, past family history, past medical history, past social history, past surgical history and problem list.       Past Medical History:  Diagnosis Date  . High cholesterol   . Hypertension     There are no active problems to display for this patient.   History reviewed. No pertinent surgical history.  OB History   No obstetric history on file.      Home Medications    Prior to Admission medications   Medication Sig Start Date End Date Taking? Authorizing Provider  cetirizine (ZYRTEC) 10 MG tablet Take 1 tablet (10 mg total) by mouth daily. 07/18/18   Lurline Idol, FNP  ibuprofen (ADVIL,MOTRIN) 600 MG tablet Take 1 tablet (600 mg total) by mouth every 6 (six) hours as needed. 07/18/18   Lurline Idol, FNP  metoprolol tartrate (LOPRESSOR) 25 MG tablet Take 1 tablet (25 mg total) by mouth 2 (two) times daily. 04/11/16 04/06/17  Wendall Stade, MD  Omega-3 Fatty Acids (FISH OIL) 1000 MG CAPS Take 1 capsule by mouth daily.    [provider]  oseltamivir (TAMIFLU) 75 MG capsule Take 1 capsule (75 mg total) by mouth every 12 (twelve) hours.  07/18/18   Lurline Idol, FNP  promethazine-dextromethorphan (PROMETHAZINE-DM) 6.25-15 MG/5ML syrup Take 5 mLs by mouth 4 (four) times daily as needed for cough. 07/18/18   Lurline Idol, FNP    Family History Family History  Problem Relation Age of Onset  . Hypertension Mother     Social History Social History   Tobacco Use  . Smoking status: Never Smoker  . Smokeless tobacco: Never Used  Substance Use Topics  . Alcohol use: No  . Drug use: No     Allergies   Percocet [oxycodone-acetaminophen]   Review of Systems Review of Systems  Constitutional: Positive for chills, diaphoresis, fatigue and fever.  HENT: Positive for congestion and sneezing.   Respiratory: Positive for cough.   Neurological: Positive for headaches.  All other systems reviewed and are negative.    Physical Exam Triage Vital Signs ED Triage Vitals [07/18/18 1858]  Enc Vitals Group     BP (!) 156/91     Pulse Rate (!) 109     Resp 18     Temp 100.2 F (37.9 C)     Temp Source Oral     SpO2 96 %     Weight      Height      Head Circumference      Peak Flow      Pain Score      Pain Loc      Pain Edu?  Excl. in GC?    No data found.  Updated Vital Signs BP (!) 156/91 (BP Location: Left Arm)   Pulse (!) 109   Temp 100.2 F (37.9 C) (Oral)   Resp 18   SpO2 96%   Visual Acuity Right Eye Distance:   Left Eye Distance:   Bilateral Distance:    Right Eye Near:   Left Eye Near:    Bilateral Near:     Physical Exam Vitals signs reviewed.  Constitutional:      General: She is not in acute distress.    Appearance: Normal appearance. She is ill-appearing. She is not toxic-appearing or diaphoretic.  HENT:     Head: Normocephalic.     Right Ear: Tympanic membrane, ear canal and external ear normal.     Left Ear: Tympanic membrane, ear canal and external ear normal.     Nose: Nose normal.     Mouth/Throat:     Mouth: Mucous membranes are moist.     Pharynx: Oropharynx  is clear.  Eyes:     Extraocular Movements: Extraocular movements intact.     Conjunctiva/sclera: Conjunctivae normal.     Pupils: Pupils are equal, round, and reactive to light.  Neck:     Musculoskeletal: Normal range of motion and neck supple.  Cardiovascular:     Rate and Rhythm: Regular rhythm. Tachycardia present.  Pulmonary:     Effort: Pulmonary effort is normal.     Breath sounds: Normal breath sounds.  Musculoskeletal: Normal range of motion.  Lymphadenopathy:     Cervical: No cervical adenopathy.  Skin:    General: Skin is warm and dry.  Neurological:     General: No focal deficit present.     Mental Status: She is alert and oriented to person, place, and time.  Psychiatric:        Mood and Affect: Mood normal.        Behavior: Behavior normal.        Thought Content: Thought content normal.        Judgment: Judgment normal.      UC Treatments / Results  Labs (all labs ordered are listed, but only abnormal results are displayed) Labs Reviewed - No data to display  EKG None  Radiology No results found.  Procedures Procedures (including critical care time)  Medications Ordered in UC Medications  acetaminophen (TYLENOL) tablet 650 mg (650 mg Oral Given 07/18/18 1904)    Initial Impression / Assessment and Plan / UC Course  I have reviewed the triage vital signs and the nursing notes.  Pertinent labs & imaging results that were available during my care of the patient were reviewed by me and considered in my medical decision making (see chart for details).     43 year old female presenting with a 2-day history of influenza-like symptoms.  Patient has low-grade fevers.  Mildly tachycardic.  Appears unwell but nontoxic.   Plan:  Supportive care with appropriate antipyretics and fluids. Educational material distributed and questions answered. Antivirals per orders. Follow up as needed.  Today's evaluation has revealed no signs of a dangerous process.  Discussed diagnosis with patient. Patient aware of their diagnosis, possible red flag symptoms to watch out for and need for close follow up. Patient understands verbal and written discharge instructions. Patient comfortable with plan and disposition.  Patient has a clear mental status at this time, good insight into illness (after discussion and teaching) and has clear judgment to make decisions regarding their care.  Documentation  was completed with the aid of voice recognition software. Transcription may contain typographical errors.   Final Clinical Impressions(s) / UC Diagnoses   Final diagnoses:  Influenza-like illness     Discharge Instructions     Take medications as directed. Drink lots of fluids. Get plenty of rest    ED Prescriptions    Medication Sig Dispense Auth. Provider   oseltamivir (TAMIFLU) 75 MG capsule Take 1 capsule (75 mg total) by mouth every 12 (twelve) hours. 10 capsule Lurline Idol, FNP   ibuprofen (ADVIL,MOTRIN) 600 MG tablet Take 1 tablet (600 mg total) by mouth every 6 (six) hours as needed. 30 tablet Lurline Idol, FNP   promethazine-dextromethorphan (PROMETHAZINE-DM) 6.25-15 MG/5ML syrup Take 5 mLs by mouth 4 (four) times daily as needed for cough. 120 mL Lurline Idol, FNP   cetirizine (ZYRTEC) 10 MG tablet Take 1 tablet (10 mg total) by mouth daily. 14 tablet Lurline Idol, FNP     Controlled Substance Prescriptions [place] Controlled Substance Registry consulted? Not Applicable   Lurline Idol, FNP 07/18/18 1950

## 2018-07-18 NOTE — Discharge Instructions (Addendum)
Take medications as directed. Drink lots of fluids. Get plenty of rest

## 2018-07-18 NOTE — ED Triage Notes (Signed)
Pt here for flu sx x 2 days

## 2019-04-21 ENCOUNTER — Other Ambulatory Visit: Payer: Self-pay

## 2019-04-21 DIAGNOSIS — Z20822 Contact with and (suspected) exposure to covid-19: Secondary | ICD-10-CM

## 2019-04-22 LAB — NOVEL CORONAVIRUS, NAA: SARS-CoV-2, NAA: NOT DETECTED

## 2019-08-17 ENCOUNTER — Emergency Department (HOSPITAL_COMMUNITY)
Admission: EM | Admit: 2019-08-17 | Discharge: 2019-08-17 | Disposition: A | Discharge status: Home / Self Care | Payer: 59 | Attending: Emergency Medicine | Admitting: Emergency Medicine

## 2019-08-17 ENCOUNTER — Encounter (HOSPITAL_COMMUNITY): Payer: Self-pay | Admitting: Emergency Medicine

## 2019-08-17 ENCOUNTER — Other Ambulatory Visit: Payer: Self-pay

## 2019-08-17 ENCOUNTER — Emergency Department (HOSPITAL_COMMUNITY): Payer: 59

## 2019-08-17 DIAGNOSIS — Z79899 Other long term (current) drug therapy: Secondary | ICD-10-CM | POA: Insufficient documentation

## 2019-08-17 DIAGNOSIS — I10 Essential (primary) hypertension: Secondary | ICD-10-CM | POA: Diagnosis not present

## 2019-08-17 DIAGNOSIS — R0789 Other chest pain: Secondary | ICD-10-CM | POA: Insufficient documentation

## 2019-08-17 DIAGNOSIS — R0602 Shortness of breath: Secondary | ICD-10-CM | POA: Diagnosis not present

## 2019-08-17 DIAGNOSIS — R071 Chest pain on breathing: Secondary | ICD-10-CM

## 2019-08-17 LAB — BASIC METABOLIC PANEL
Anion gap: 8 (ref 5–15)
BUN: 12 mg/dL (ref 6–20)
CO2: 23 mmol/L (ref 22–32)
Calcium: 9.1 mg/dL (ref 8.9–10.3)
Chloride: 105 mmol/L (ref 98–111)
Creatinine, Ser: 0.87 mg/dL (ref 0.44–1.00)
GFR calc Af Amer: >60 mL/min (ref >60)
GFR calc non Af Amer: >60 mL/min (ref >60)
Glucose, Bld: 123 mg/dL — ABNORMAL HIGH (ref 70–99)
Potassium: 4 mmol/L (ref 3.5–5.1)
Sodium: 136 mmol/L (ref 135–145)

## 2019-08-17 LAB — CBC
HCT: 40.3 % (ref 36.0–46.0)
Hemoglobin: 12.9 g/dL (ref 12.0–15.0)
MCH: 24.5 pg — ABNORMAL LOW (ref 26.0–34.0)
MCHC: 32 g/dL (ref 30.0–36.0)
MCV: 76.6 fL — ABNORMAL LOW (ref 80.0–100.0)
Platelets: 297 10*3/uL (ref 150–400)
RBC: 5.26 MIL/uL — ABNORMAL HIGH (ref 3.87–5.11)
RDW: 16 % — ABNORMAL HIGH (ref 11.5–15.5)
WBC: 16.5 10*3/uL — ABNORMAL HIGH (ref 4.0–10.5)
nRBC: 0 % (ref 0.0–0.2)

## 2019-08-17 LAB — HEPATIC FUNCTION PANEL
ALT: 18 U/L (ref 0–44)
AST: 19 U/L (ref 15–41)
Albumin: 4.1 g/dL (ref 3.5–5.0)
Alkaline Phosphatase: 66 U/L (ref 38–126)
Bilirubin, Direct: <0.1 mg/dL (ref 0.0–0.2)
Total Bilirubin: 0.7 mg/dL (ref 0.3–1.2)
Total Protein: 8.3 g/dL — ABNORMAL HIGH (ref 6.5–8.1)

## 2019-08-17 LAB — TROPONIN I (HIGH SENSITIVITY)
Troponin I (High Sensitivity): 2 ng/L (ref <18)
Troponin I (High Sensitivity): 3 ng/L (ref <18)

## 2019-08-17 LAB — I-STAT BETA HCG BLOOD, ED (MC, WL, AP ONLY): I-stat hCG, quantitative: <5 m[IU]/mL (ref <5)

## 2019-08-17 LAB — D-DIMER, QUANTITATIVE: D-Dimer, Quant: 0.27 ug/mL-FEU (ref 0.00–0.50)

## 2019-08-17 LAB — LIPASE, BLOOD: Lipase: 23 U/L (ref 11–51)

## 2019-08-17 MED ORDER — LIDOCAINE VISCOUS HCL 2 % MT SOLN
15.0000 mL | Freq: Once | OROMUCOSAL | Status: AC
  Administered 2019-08-17: 15 mL via ORAL
  Filled 2019-08-17: qty 15

## 2019-08-17 MED ORDER — ALUM & MAG HYDROXIDE-SIMETH 200-200-20 MG/5ML PO SUSP
30.0000 mL | Freq: Once | ORAL | Status: AC
  Administered 2019-08-17: 30 mL via ORAL
  Filled 2019-08-17: qty 30

## 2019-08-17 MED ORDER — SODIUM CHLORIDE 0.9 % IV BOLUS
1000.0000 mL | Freq: Once | INTRAVENOUS | Status: AC
  Administered 2019-08-17: 1000 mL via INTRAVENOUS

## 2019-08-17 NOTE — ED Notes (Signed)
Patient ambulated to the restroom without assistance 

## 2019-08-17 NOTE — Discharge Instructions (Signed)
Your work-up today was overall reassuring.  The cardiac enzymes were negative both times we checked them and your EKG did not show evidence of cardiac injury.  Your blood clot test was negative and your chest x-ray was reassuring.  We feel that you are safe to be discharged but please follow-up with your primary doctor and cardiology as well.  Please rest and stay hydrated.  If any symptoms change or worsen, please return to nearest emergency department.

## 2019-08-17 NOTE — ED Notes (Signed)
PT stated she was anxious to be in the hospital.

## 2019-08-17 NOTE — ED Provider Notes (Signed)
[facility] DEPT Provider Note   [name]: [id number] Arrival date & time: 08/17/19  9201     History Chief Complaint  Patient presents with  . Chest Pain    Melanie [name] is a 44 y.o. female.  The history is provided by the patient and medical records. No language interpreter was used.  Chest Pain Pain location:  Substernal area Pain quality: aching, pressure, throbbing and tightness   Pain quality: not sharp, not stabbing and not tearing   Pain radiates to:  Upper back and L arm Pain severity:  Mild Onset quality:  Sudden Duration:  5 hours Timing:  Constant Progression:  Unchanged Chronicity:  Recurrent Context: breathing   Relieved by:  Nothing Worsened by:  Deep breathing Ineffective treatments:  None tried Associated symptoms: back pain, palpitations and shortness of breath   Associated symptoms: no abdominal pain, no anxiety, no cough, no diaphoresis, no dizziness, no dysphagia, no fatigue, no fever, no headache, no lower extremity edema, no nausea, no near-syncope, no numbness, no vomiting and no weakness   Risk factors: high cholesterol and hypertension   Risk factors: no diabetes mellitus, not female, no prior DVT/PE and no smoking        Past Medical History:  Diagnosis Date  . High cholesterol   . Hypertension     There are no problems to display for this patient.   History reviewed. No pertinent surgical history.   OB History   No obstetric history on file.     Family History  Problem Relation Age of Onset  . Hypertension Mother     Social History   Tobacco Use  . Smoking status: Never Smoker  . Smokeless tobacco: Never Used  Substance Use Topics  . Alcohol use: No  . Drug use: No    Home Medications Prior to Admission medications   Medication Sig Start Date End Date Taking? Authorizing Provider  cetirizine (ZYRTEC) 10 MG tablet Take 1 tablet (10 mg total) by mouth daily. 07/18/18   Enrique Sack, FNP    ibuprofen (ADVIL,MOTRIN) 600 MG tablet Take 1 tablet (600 mg total) by mouth every 6 (six) hours as needed. 07/18/18   Enrique Sack, FNP  metoprolol tartrate (LOPRESSOR) 25 MG tablet Take 1 tablet (25 mg total) by mouth 2 (two) times daily. 04/11/16 04/06/17  Josue Hector, MD  Omega-3 Fatty Acids (FISH OIL) 1000 MG CAPS Take 1 capsule by mouth daily.    [provider]  oseltamivir (TAMIFLU) 75 MG capsule Take 1 capsule (75 mg total) by mouth every 12 (twelve) hours. 07/18/18   Enrique Sack, FNP  promethazine-dextromethorphan (PROMETHAZINE-DM) 6.25-15 MG/5ML syrup Take 5 mLs by mouth 4 (four) times daily as needed for cough. 07/18/18   Enrique Sack, FNP    Allergies    Percocet [oxycodone-acetaminophen]  Review of Systems   Review of Systems  Constitutional: Negative for chills, diaphoresis, fatigue and fever.  HENT: Negative for congestion and trouble swallowing.   Eyes: Negative for visual disturbance.  Respiratory: Positive for chest tightness and shortness of breath. Negative for cough, wheezing and stridor.   Cardiovascular: Positive for chest pain and palpitations. Negative for leg swelling and near-syncope.  Gastrointestinal: Negative for abdominal pain, constipation, diarrhea, nausea and vomiting.  Genitourinary: Negative for dysuria, flank pain and frequency.  Musculoskeletal: Positive for back pain. Negative for neck pain and neck stiffness.  Skin: Negative for rash and wound.  Neurological: Negative for dizziness, speech difficulty, weakness, light-headedness, numbness and headaches.  Psychiatric/Behavioral: Negative for agitation, behavioral problems and confusion.  All other systems reviewed and are negative.   Physical Exam Updated Vital Signs BP 133/83   Pulse 87   Temp 98.1 F (36.7 C) (Oral)   Resp 16   LMP 07/21/2019   SpO2 100%   Physical Exam Vitals and nursing note reviewed.  Constitutional:      General: She is not in acute  distress.    Appearance: She is well-developed. She is not ill-appearing, toxic-appearing or diaphoretic.  HENT:     Head: Normocephalic and atraumatic.     Right Ear: External ear normal.     Left Ear: External ear normal.     Nose: Nose normal.     Mouth/Throat:     Pharynx: No oropharyngeal exudate.  Eyes:     Extraocular Movements: Extraocular movements intact.     Conjunctiva/sclera: Conjunctivae normal.     Pupils: Pupils are equal, round, and reactive to light.  Cardiovascular:     Rate and Rhythm: Regular rhythm. Tachycardia present.     Heart sounds: Normal heart sounds. No murmur.  Pulmonary:     Effort: Pulmonary effort is normal. Tachypnea present. No respiratory distress.     Breath sounds: No stridor. No decreased breath sounds, wheezing, rhonchi or rales.  Chest:     Chest wall: Tenderness present.  Abdominal:     General: There is no distension.     Tenderness: There is no abdominal tenderness. There is no rebound.  Musculoskeletal:     Cervical back: Normal range of motion and neck supple.     Right lower leg: No tenderness. No edema.     Left lower leg: No tenderness. No edema.  Skin:    General: Skin is warm.     Capillary Refill: Capillary refill takes less than 2 seconds.     Coloration: Skin is not pale.     Findings: No erythema or rash.  Neurological:     General: No focal deficit present.     Mental Status: She is alert and oriented to person, place, and time.     Cranial Nerves: No cranial nerve deficit.     Motor: No abnormal muscle tone.     Coordination: Coordination normal.     Deep Tendon Reflexes: Reflexes are normal and symmetric.  Psychiatric:        Mood and Affect: Mood is anxious.     ED Results / Procedures / Treatments   Labs (all labs ordered are listed, but only abnormal results are displayed) Labs Reviewed  BASIC METABOLIC PANEL - Abnormal; Notable for the following components:      Result Value   Glucose, Bld 123 (*)     All other components within normal limits  CBC - Abnormal; Notable for the following components:   WBC 16.5 (*)    RBC 5.26 (*)    MCV 76.6 (*)    MCH 24.5 (*)    RDW 16.0 (*)    All other components within normal limits  HEPATIC FUNCTION PANEL - Abnormal; Notable for the following components:   Total Protein 8.3 (*)    All other components within normal limits  LIPASE, BLOOD  D-DIMER, QUANTITATIVE (NOT AT [facility])  I-STAT BETA HCG BLOOD, ED ([facility], [facility], [facility] ONLY)  TROPONIN I (HIGH SENSITIVITY)  TROPONIN I (HIGH SENSITIVITY)    EKG EKG Interpretation  Date/Time:  Sunday August 17 2019 08:29:52 EDT Ventricular Rate:  122 PR Interval:  QRS Duration: 81 QT Interval:  311 QTC Calculation: 443 R Axis:   70 Text Interpretation: Sinus tachycardia Probable left atrial enlargement Nonspecific repol abnormality, diffuse leads 12 Lead; Mason-Likar When compared to prior, simiar tachycardia. No STEMI Confirmed by Antony Blackbird [phone]) on 08/17/2019 8:36:28 AM   Radiology DG Chest 2 View  Result Date: 08/17/2019 CLINICAL DATA:  Chest pain EXAM: CHEST - 2 VIEW COMPARISON:  04/07/2016 FINDINGS: The heart size and mediastinal contours are within normal limits. Both lungs are clear. The visualized skeletal structures are unremarkable. IMPRESSION: No active cardiopulmonary disease. Electronically Signed   By: Kathreen Devoid   On: 08/17/2019 09:08    Procedures Procedures (including critical care time)  Medications Ordered in ED Medications  alum & mag hydroxide-simeth (MAALOX/MYLANTA) 200-200-20 MG/5ML suspension 30 mL (30 mLs Oral Given 08/17/19 0918)    And  lidocaine (XYLOCAINE) 2 % viscous mouth solution 15 mL (15 mLs Oral Given 08/17/19 0919)  sodium chloride 0.9 % bolus 1,000 mL (0 mLs Intravenous Stopped 08/17/19 1021)    ED Course  I have reviewed the triage vital signs and the nursing notes.  Pertinent labs & imaging results that were available during my care of the patient were  reviewed by me and considered in my medical decision making (see chart for details).    MDM Rules/Calculators/A&P                      Bellarose Hedstrom is a pleasant 44 y.o. female with a past medical history significant for hypertension and hypercholesterolemia who presents with recurrent chest pain and shortness of breath.  Patient reports that years ago she had a similar episode of chest pain and had a work-up that was overall reassuring.  She followed up with cardiology but did not follow-up fully.  She reports that she did not have symptoms until earlier this week she had an episode lasting several hours of severe shortness of breath and pleuritic chest discomfort in her central chest.  She reports that it went away and she did not seek attention however this morning it woke her up at 4:30 AM with severe chest shortness of breath and tightness.  She reports the pain was extremely pleuritic despite different positions.  It was not exertional.  She reports it radiated to her upper back and into her left arm.  She reports her blood pressure has been elevated but it has only been as high as the 056P and 794I systolic.  It has never been in the 200s.  She denies any leg pain or leg swelling and no history of DVT or PE.  She denies recent fevers, chills, congestion, or cough.  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  She reports that she ate some mac & cheese, okra, and wings last night but has not had significant GERD history.  She reports no nausea, vomiting, diaphoresis lightheadedness, or syncope.  She does report some palpitations and feeling her heart race.  Her heart was in the 120s and 130s on arrival.  She denies any urinary or GI symptoms.  No other complaints.  No history of cardiac disease herself.  On exam, lungs are clear.  Chest is slightly tender.  Good pulses in all extremities and no lower extremity tenderness or swelling.  Her  upper extremity pulses are symmetric.  No murmur appreciated.  Abdomen nontender.  Patient remains tachycardic but is maintaining oxygen  saturation on percent on room air.  Clinically I do feel need to rule out concerning etiology of the patient's shortness breath and chest discomfort.  We will get D-dimer given her tachycardia with a pleuritic chest pain and shortness of breath.  Will get chest x-ray to look for pneumonia, pneumothorax, or other abnormality.  Anticipate getting a CT PE study if her dimer is elevated.  Lower suspicion for dissection given the lack of a stabbing or tearing type pain and her only mildly elevated blood pressure in the 150s.  She has symmetric pulses and no neurologic deficits in her legs.  Low suspicion for dissection at this time.  Patient will be given a GI cocktail given her food intake last night may be related to her discomfort today.  We will get other labs as well.  EKG shows sinus tachycardia but no STEMI.  Appears similar to prior.  Suspect there is component of anxiety and stress as well.  She will be given fluids for the tachycardia.  Anticipate reassessment after work-up.      Delta troponin was negative. D-dimer is negative. X-ray is reassuring. Labs are otherwise all reassuring. Patient felt better after the GI cocktail and fluids. Heart rate has improved into the 80s. She is no longer tachycardic or having palpitations. Suspect component of anxiety, some dehydration, potentially GI symptoms, and chest wall discomfort given the tenderness. Given her heart score of 3 with no troponins, we feel she safe for discharge home. Patient will follow with PCP as well as given the number for cardiology for follow-up. Patient and family agreed with plan of care and patient was discharged in good condition with instructions to follow-up.    Final Clinical Impression(s) / ED Diagnoses Final diagnoses:  Chest pain on breathing  Shortness of breath    Rx / DC  Orders ED Discharge Orders    None      Clinical Impression: 1. Chest pain on breathing   2. Shortness of breath     Disposition: Discharge  Condition: Good  I have discussed the results, Dx and Tx plan with the pt(& family if present). He/she/they expressed understanding and agree(s) with the plan. Discharge instructions discussed at great length. Strict return precautions discussed and pt &/or family have verbalized understanding of the instructions. No further questions at time of discharge.    Discharge Medication List as of 08/17/2019  1:39 PM      Follow Up: Nicola Girt, DO [address] [place] [address] [place] [facility] [place] [id number] mc [place] [facility] [place] [address] [id number]       Sumayyah Custodio, Gwenyth Allegra, MD 08/17/19 [phone]

## 2019-08-17 NOTE — ED Triage Notes (Signed)
Per pt, states she stated having SOB, pain between shoulder blades, to chest and down left arm-woke her out of her sleep

## 2020-05-22 IMAGING — CR DG CHEST 2V
2 series · 2 of 2 positions shown · non-contrast
Comparison: 04/07/2016

CLINICAL DATA: Chest pain

EXAM:
CHEST - 2 VIEW

[w chest pa]
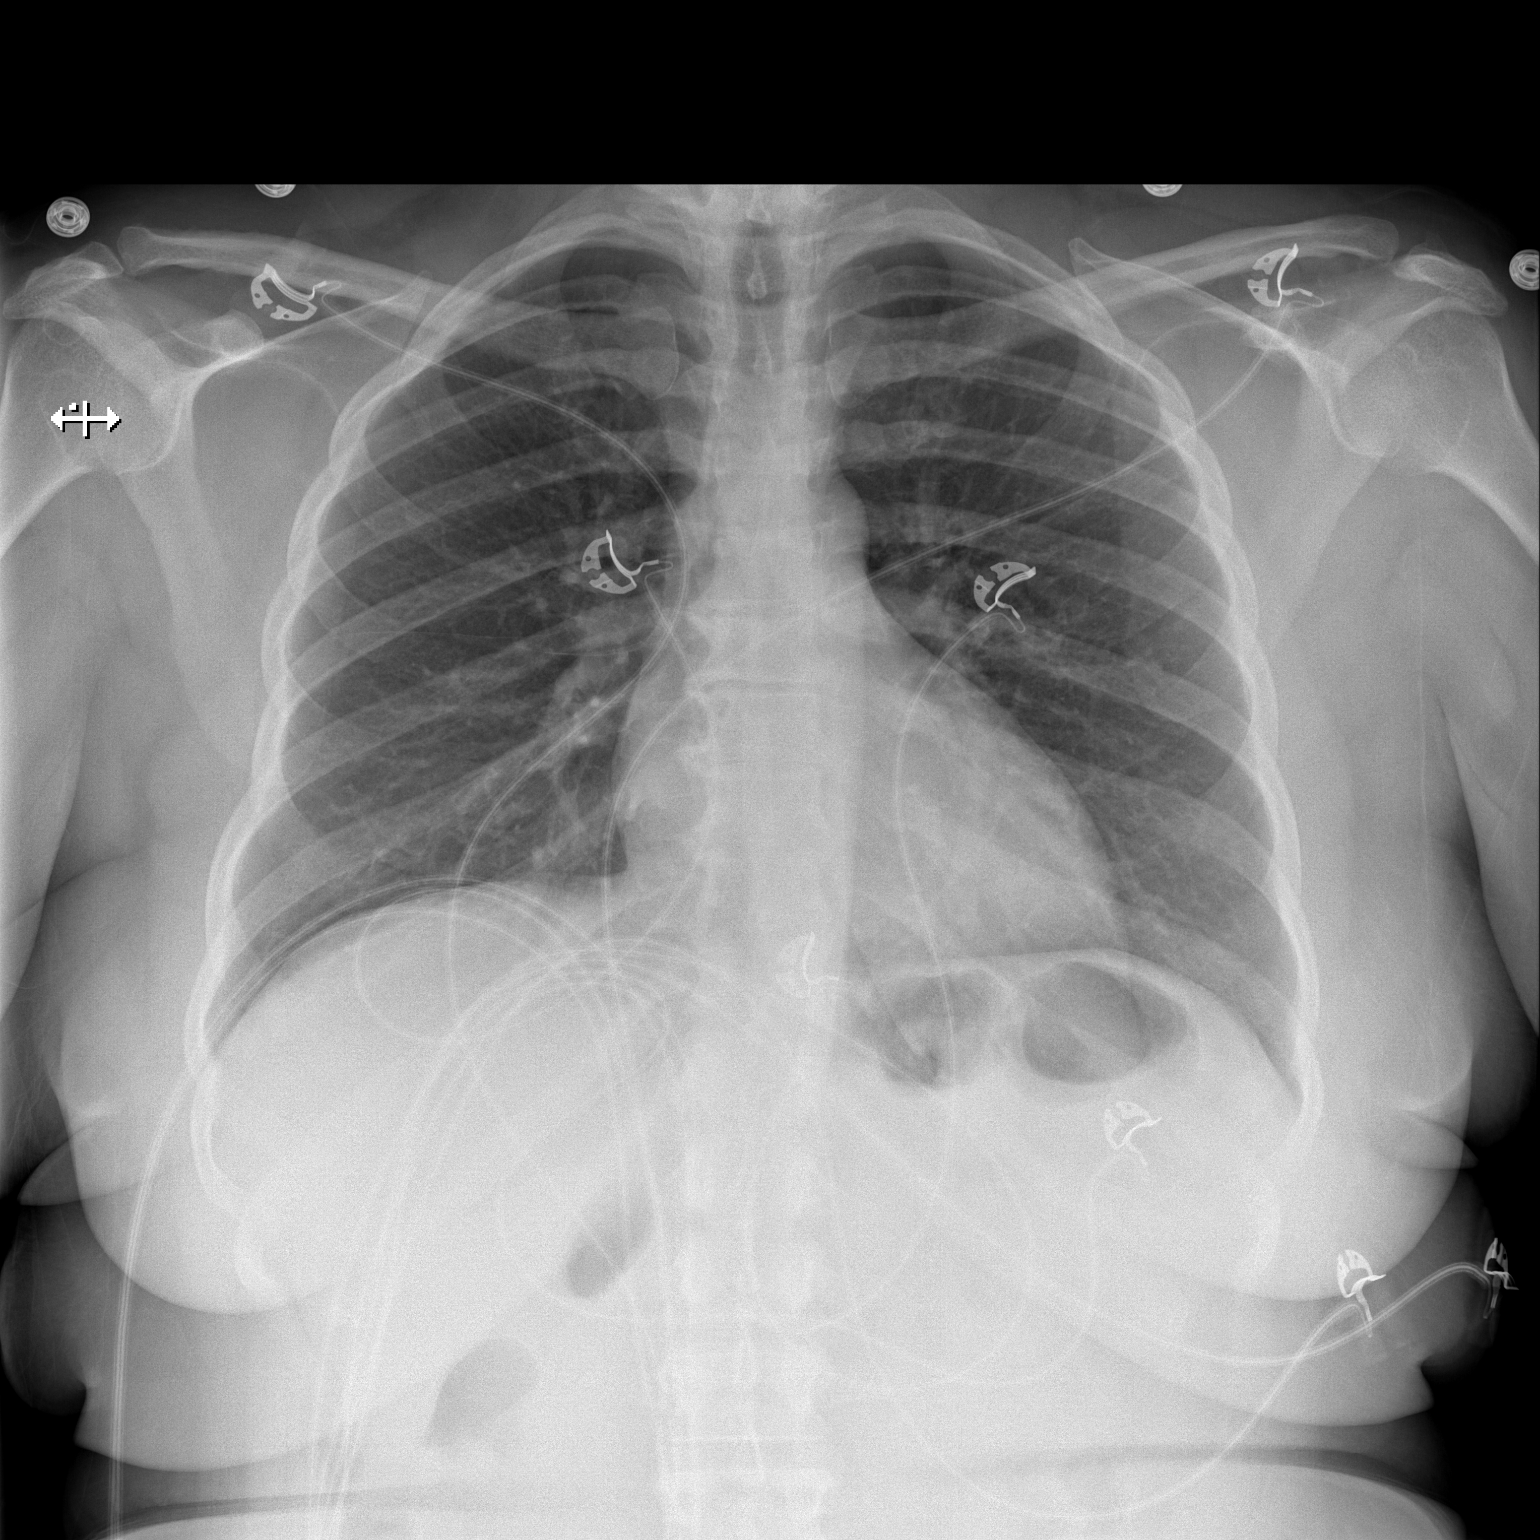

[w chest lat]
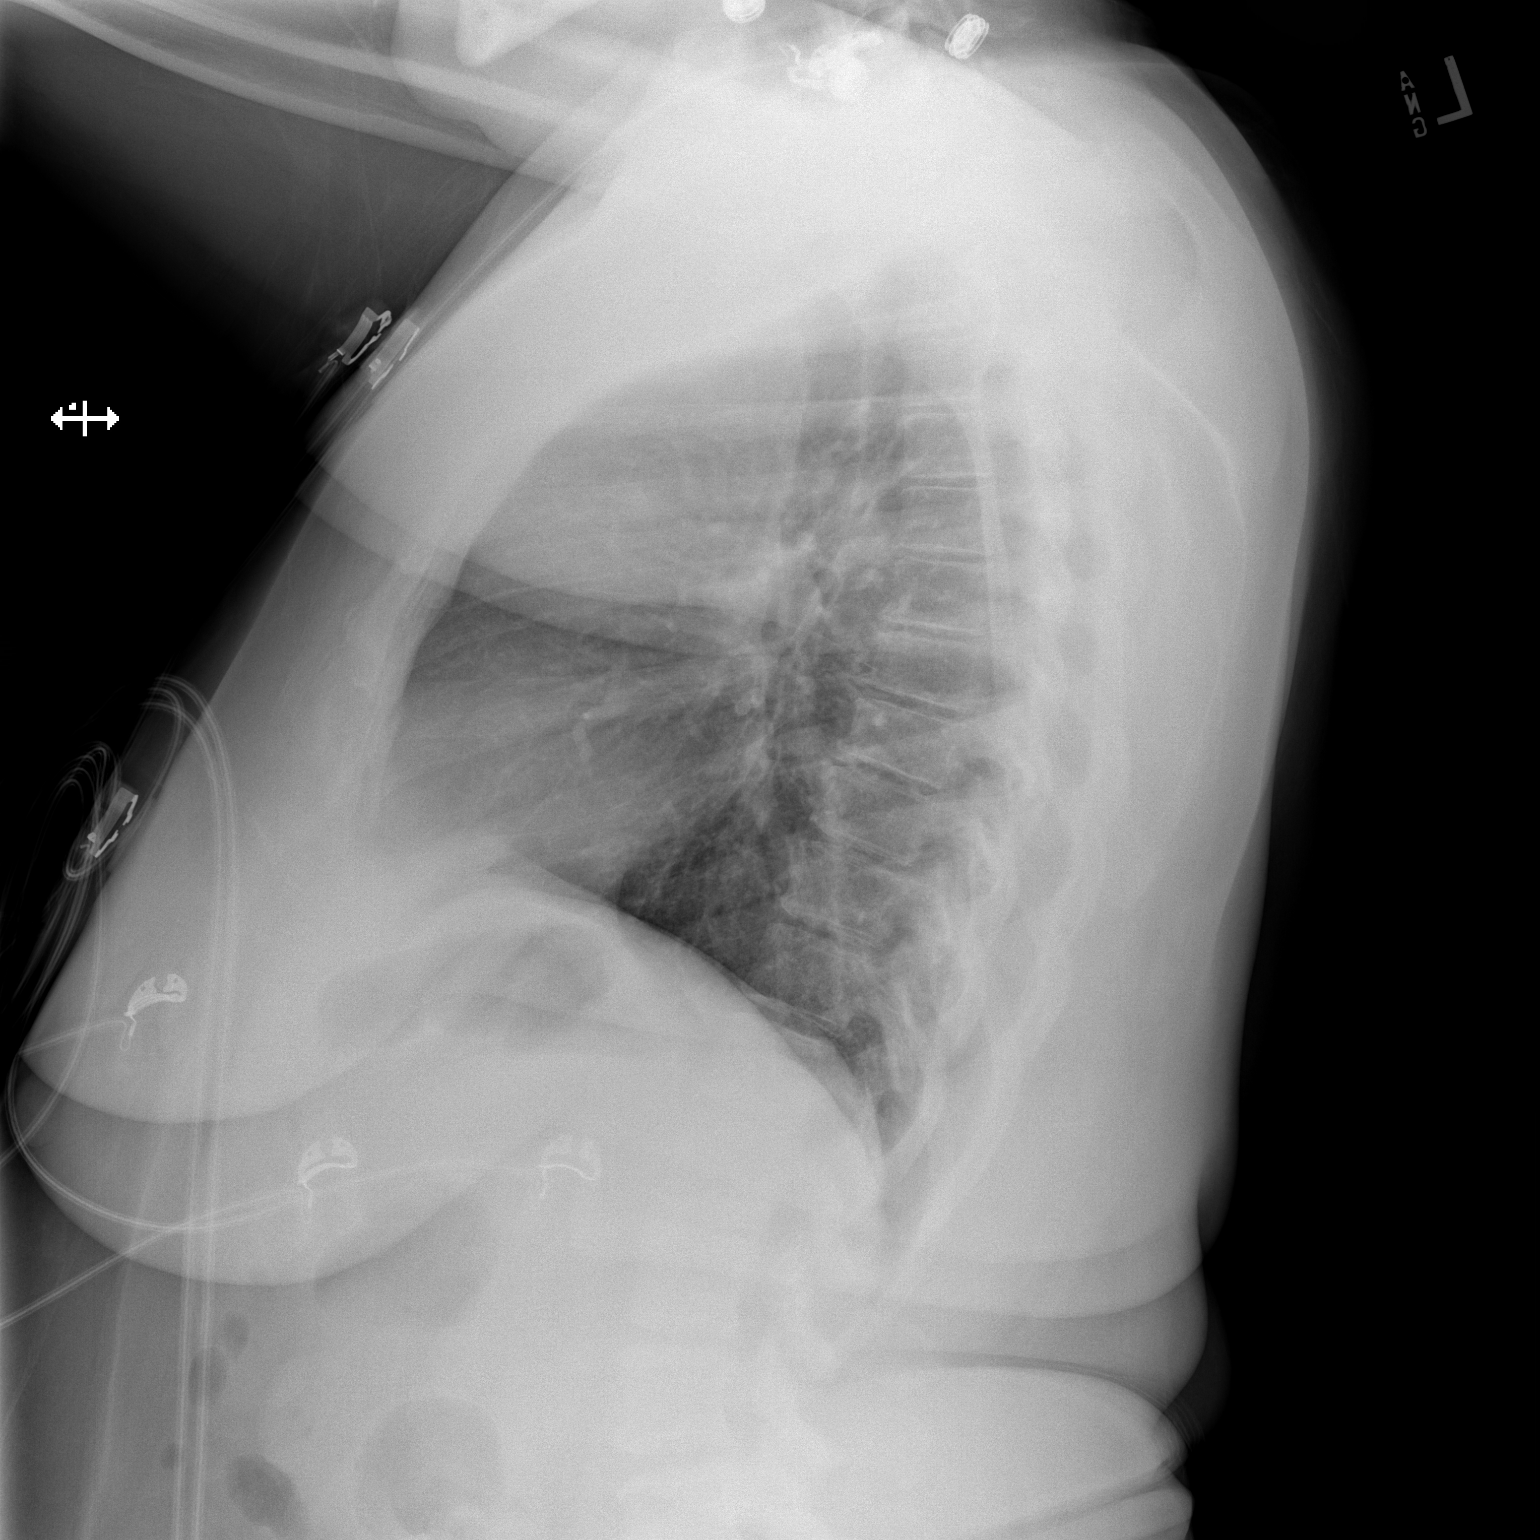

[2 of 2 positions shown; findings below may reference images not displayed]

FINDINGS: The heart size and mediastinal contours are within normal limits.
Both lungs are clear. The visualized skeletal structures are
unremarkable.
IMPRESSION: No active cardiopulmonary disease.

## 2021-08-02 ENCOUNTER — Other Ambulatory Visit: Payer: Self-pay | Admitting: Obstetrics and Gynecology

## 2021-08-02 ENCOUNTER — Other Ambulatory Visit (HOSPITAL_COMMUNITY)
Admission: RE | Admit: 2021-08-02 | Discharge: 2021-08-02 | Disposition: A | Discharge status: Home / Self Care | Payer: 59 | Source: Ambulatory Visit | Attending: Obstetrics and Gynecology | Admitting: Obstetrics and Gynecology

## 2021-08-02 DIAGNOSIS — Z01419 Encounter for gynecological examination (general) (routine) without abnormal findings: Secondary | ICD-10-CM | POA: Diagnosis present

## 2021-08-05 LAB — CYTOLOGY - PAP
Comment: NEGATIVE
Diagnosis: NEGATIVE
High risk HPV: NEGATIVE

## 2023-03-23 DIAGNOSIS — Z03818 Encounter for observation for suspected exposure to other biological agents ruled out: Secondary | ICD-10-CM | POA: Diagnosis not present

## 2023-06-08 DIAGNOSIS — Z1231 Encounter for screening mammogram for malignant neoplasm of breast: Secondary | ICD-10-CM | POA: Diagnosis not present

## 2023-06-28 ENCOUNTER — Other Ambulatory Visit: Payer: Self-pay | Admitting: Physician Assistant

## 2023-06-28 ENCOUNTER — Encounter: Payer: Self-pay | Admitting: Physician Assistant

## 2023-06-28 NOTE — Progress Notes (Addendum)
Error
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
## 2023-07-05 ENCOUNTER — Encounter: Payer: Self-pay | Admitting: Internal Medicine

## 2023-07-05 NOTE — Progress Notes (Deleted)
48 year old seen at [facility].   She wanted to follow up in regards where we are with appointment for PCP. Patient was informed appointment will be made today.   Complaints of hands dryness, lotions for hands was provided.

## 2023-07-09 ENCOUNTER — Encounter: Payer: Self-pay | Admitting: *Deleted

## 2023-07-09 NOTE — Congregational Nurse Program (Deleted)
  Dept: [phone]   Congregational Nurse Program Note  Date of Encounter: 07/09/2023  Past Medical History: Past Medical History:  Diagnosis Date   High cholesterol    Hypertension     Encounter Details:  Community Questionnaire - 07/09/23 1252       Questionnaire   Ask client: Do you give verbal consent for me to treat you today? Yes    Student Assistance N/A    Location Patient Served  [facility]    Encounter Setting CN site;Phone/Text/Email    Population Status Clinical biochemist or [place] Insurance;Medicaid    Insurance/Financial Assistance Referral N/A    Medication N/A    Medical Provider Yes    Screening Referrals Made N/A    Medical Referrals Made [facility]    Medical Appointment Completed N/A    CNP Interventions Advocate/Support;[facility];Case Management    Screenings CN Performed N/A    ED Visit Averted N/A    Life-Saving Intervention Made N/A            MD at [facility] clinic requested writer set up appointment for a PCP. Appointment made with [facility] [address] [facility] [place] [facility] [address] [place]. Appt with Ricky Stabs PA April 28th 10:20. Information written and given to client at [facility].  Abdurrahman Petersheim W RN CN

## 2023-07-11 ENCOUNTER — Encounter: Payer: Self-pay | Admitting: *Deleted

## 2023-07-11 NOTE — Congregational Nurse Program (Deleted)
  Dept: [phone]   Congregational Nurse Program Note  Date of Encounter: 07/11/2023  Past Medical History: Past Medical History:  Diagnosis Date   High cholesterol    Hypertension     Encounter Details:  Community Questionnaire - 07/11/23 1221       Questionnaire   Ask client: Do you give verbal consent for me to treat you today? Yes    Student Assistance N/A    Location Patient Served  [facility]    Encounter Setting CN site    Population Status Unhoused    Engineer, building services or [place] Insurance    Insurance/Financial Assistance Referral N/A    Medication N/A    Medical Provider Yes    Screening Referrals Made N/A    Medical Referrals Made N/A    Medical Appointment Completed N/A    CNP Interventions Advocate/Support    Screenings CN Performed N/A    ED Visit Averted N/A    Life-Saving Intervention Made N/A            Client requesting XL pull up briefs and protein shakes. Given to client as requested.  Jayson Waterhouse W RN CN

## 2023-07-16 NOTE — Congregational Nurse Program (Signed)
 A user error has taken place: charting done on wrong patient and has been corrected.

## 2023-08-10 DIAGNOSIS — R0989 Other specified symptoms and signs involving the circulatory and respiratory systems: Secondary | ICD-10-CM | POA: Diagnosis not present

## 2023-08-10 DIAGNOSIS — Z03818 Encounter for observation for suspected exposure to other biological agents ruled out: Secondary | ICD-10-CM | POA: Diagnosis not present

## 2023-09-17 ENCOUNTER — Ambulatory Visit: Payer: 59 | Admitting: Family

## 2024-02-04 DIAGNOSIS — Z01419 Encounter for gynecological examination (general) (routine) without abnormal findings: Secondary | ICD-10-CM | POA: Diagnosis not present

## 2024-03-20 DIAGNOSIS — D509 Iron deficiency anemia, unspecified: Secondary | ICD-10-CM | POA: Diagnosis not present

## 2024-03-20 DIAGNOSIS — E7800 Pure hypercholesterolemia, unspecified: Secondary | ICD-10-CM | POA: Diagnosis not present

## 2024-03-20 DIAGNOSIS — I10 Essential (primary) hypertension: Secondary | ICD-10-CM | POA: Diagnosis not present

## 2024-03-21 DIAGNOSIS — Z0000 Encounter for general adult medical examination without abnormal findings: Secondary | ICD-10-CM | POA: Diagnosis not present

## 2024-03-21 DIAGNOSIS — I10 Essential (primary) hypertension: Secondary | ICD-10-CM | POA: Diagnosis not present

## 2024-03-21 DIAGNOSIS — E7800 Pure hypercholesterolemia, unspecified: Secondary | ICD-10-CM | POA: Diagnosis not present

## 2024-03-21 DIAGNOSIS — Z6841 Body Mass Index (BMI) 40.0 and over, adult: Secondary | ICD-10-CM | POA: Diagnosis not present

## 2024-03-21 DIAGNOSIS — D509 Iron deficiency anemia, unspecified: Secondary | ICD-10-CM | POA: Diagnosis not present

## 2024-03-24 ENCOUNTER — Other Ambulatory Visit (HOSPITAL_BASED_OUTPATIENT_CLINIC_OR_DEPARTMENT_OTHER): Payer: Self-pay | Admitting: Internal Medicine

## 2024-03-24 DIAGNOSIS — E7800 Pure hypercholesterolemia, unspecified: Secondary | ICD-10-CM

## 2024-05-27 ENCOUNTER — Other Ambulatory Visit: Payer: Self-pay
